# Patient Record
Sex: Female | Born: 1985 | Race: Black or African American | Hispanic: No | Marital: Single | Smoking: Current some day smoker
Health system: Southern US, Community
[De-identification: ages and names within clinical notes are randomized; demographics above are authoritative.]

## PROBLEM LIST (undated history)

## (undated) DIAGNOSIS — J45909 Unspecified asthma, uncomplicated: Secondary | ICD-10-CM

---

## 2018-01-05 ENCOUNTER — Encounter (HOSPITAL_COMMUNITY): Payer: Self-pay

## 2018-01-05 ENCOUNTER — Emergency Department (HOSPITAL_COMMUNITY)
Admission: EM | Admit: 2018-01-05 | Discharge: 2018-01-05 | Disposition: A | Discharge status: Home / Self Care | Payer: Self-pay | Attending: Emergency Medicine | Admitting: Emergency Medicine

## 2018-01-05 ENCOUNTER — Emergency Department (HOSPITAL_COMMUNITY): Payer: Self-pay

## 2018-01-05 ENCOUNTER — Other Ambulatory Visit: Payer: Self-pay

## 2018-01-05 DIAGNOSIS — F17210 Nicotine dependence, cigarettes, uncomplicated: Secondary | ICD-10-CM | POA: Insufficient documentation

## 2018-01-05 DIAGNOSIS — Z23 Encounter for immunization: Secondary | ICD-10-CM | POA: Insufficient documentation

## 2018-01-05 DIAGNOSIS — N939 Abnormal uterine and vaginal bleeding, unspecified: Secondary | ICD-10-CM | POA: Insufficient documentation

## 2018-01-05 DIAGNOSIS — J45909 Unspecified asthma, uncomplicated: Secondary | ICD-10-CM | POA: Insufficient documentation

## 2018-01-05 DIAGNOSIS — R079 Chest pain, unspecified: Secondary | ICD-10-CM | POA: Insufficient documentation

## 2018-01-05 HISTORY — DX: Unspecified asthma, uncomplicated: J45.909

## 2018-01-05 LAB — WET PREP, GENITAL
Clue Cells Wet Prep HPF POC: NONE SEEN
Sperm: NONE SEEN
TRICH WET PREP: NONE SEEN
Yeast Wet Prep HPF POC: NONE SEEN

## 2018-01-05 MED ORDER — TETANUS-DIPHTH-ACELL PERTUSSIS 5-2.5-18.5 LF-MCG/0.5 IM SUSP
0.5000 mL | Freq: Once | INTRAMUSCULAR | Status: AC
  Administered 2018-01-05: 0.5 mL via INTRAMUSCULAR
  Filled 2018-01-05: qty 0.5

## 2018-01-05 MED ORDER — KETOROLAC TROMETHAMINE 30 MG/ML IJ SOLN
15.0000 mg | Freq: Once | INTRAMUSCULAR | Status: AC
  Administered 2018-01-05: 15 mg via INTRAMUSCULAR
  Filled 2018-01-05: qty 1

## 2018-01-05 NOTE — ED Notes (Signed)
Skin tear noted to rt upper thigh, cleaned with saline, Telfa and gauze dressing applied.

## 2018-01-05 NOTE — ED Notes (Signed)
ED Provider at bedside. 

## 2018-01-05 NOTE — ED Notes (Signed)
Right hand has been cleaned and dressed.
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
## 2018-01-05 NOTE — ED Notes (Signed)
Pt refused to provide urine sample, stated "If they don't have an order, they won't get any urine, I know my rights." Pt reports small gash on upper right leg and vaginal bleeding. Reported to RN
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
## 2018-01-07 LAB — GC/CHLAMYDIA PROBE AMP (~~LOC~~) NOT AT ARMC
CHLAMYDIA, DNA PROBE: NEGATIVE
Neisseria Gonorrhea: NEGATIVE

## 2019-04-29 ENCOUNTER — Other Ambulatory Visit: Payer: Self-pay

## 2019-04-29 ENCOUNTER — Emergency Department (HOSPITAL_COMMUNITY): Payer: Self-pay

## 2019-04-29 ENCOUNTER — Emergency Department (HOSPITAL_COMMUNITY)
Admission: EM | Admit: 2019-04-29 | Discharge: 2019-04-29 | Disposition: A | Discharge status: Home / Self Care | Payer: Self-pay | Attending: Emergency Medicine | Admitting: Emergency Medicine

## 2019-04-29 ENCOUNTER — Encounter (HOSPITAL_COMMUNITY): Payer: Self-pay

## 2019-04-29 DIAGNOSIS — F17290 Nicotine dependence, other tobacco product, uncomplicated: Secondary | ICD-10-CM | POA: Insufficient documentation

## 2019-04-29 DIAGNOSIS — N730 Acute parametritis and pelvic cellulitis: Secondary | ICD-10-CM

## 2019-04-29 DIAGNOSIS — B9689 Other specified bacterial agents as the cause of diseases classified elsewhere: Secondary | ICD-10-CM | POA: Insufficient documentation

## 2019-04-29 DIAGNOSIS — N760 Acute vaginitis: Secondary | ICD-10-CM | POA: Insufficient documentation

## 2019-04-29 DIAGNOSIS — J45909 Unspecified asthma, uncomplicated: Secondary | ICD-10-CM | POA: Insufficient documentation

## 2019-04-29 DIAGNOSIS — R102 Pelvic and perineal pain: Secondary | ICD-10-CM | POA: Insufficient documentation

## 2019-04-29 DIAGNOSIS — N739 Female pelvic inflammatory disease, unspecified: Secondary | ICD-10-CM | POA: Insufficient documentation

## 2019-04-29 LAB — URINALYSIS, ROUTINE W REFLEX MICROSCOPIC
Bilirubin Urine: NEGATIVE
Glucose, UA: NEGATIVE mg/dL
Ketones, ur: 5 mg/dL — AB
Nitrite: NEGATIVE
Protein, ur: NEGATIVE mg/dL
Specific Gravity, Urine: 1.018 (ref 1.005–1.030)
pH: 7 (ref 5.0–8.0)

## 2019-04-29 LAB — COMPREHENSIVE METABOLIC PANEL
ALT: 21 U/L (ref 0–44)
AST: 18 U/L (ref 15–41)
Albumin: 3.5 g/dL (ref 3.5–5.0)
Alkaline Phosphatase: 73 U/L (ref 38–126)
Anion gap: 8 (ref 5–15)
BUN: 7 mg/dL (ref 6–20)
CO2: 23 mmol/L (ref 22–32)
Calcium: 8.6 mg/dL — ABNORMAL LOW (ref 8.9–10.3)
Chloride: 105 mmol/L (ref 98–111)
Creatinine, Ser: 0.76 mg/dL (ref 0.44–1.00)
GFR calc Af Amer: >60 mL/min (ref >60)
GFR calc non Af Amer: >60 mL/min (ref >60)
Glucose, Bld: 89 mg/dL (ref 70–99)
Potassium: 3.7 mmol/L (ref 3.5–5.1)
Sodium: 136 mmol/L (ref 135–145)
Total Bilirubin: 0.7 mg/dL (ref 0.3–1.2)
Total Protein: 7.5 g/dL (ref 6.5–8.1)

## 2019-04-29 LAB — CBC WITH DIFFERENTIAL/PLATELET
Abs Immature Granulocytes: 0.04 10*3/uL (ref 0.00–0.07)
Basophils Absolute: 0 10*3/uL (ref 0.0–0.1)
Basophils Relative: 0 %
Eosinophils Absolute: 0.1 10*3/uL (ref 0.0–0.5)
Eosinophils Relative: 1 %
HCT: 36.8 % (ref 36.0–46.0)
Hemoglobin: 12.6 g/dL (ref 12.0–15.0)
Immature Granulocytes: 0 %
Lymphocytes Relative: 16 %
Lymphs Abs: 2.2 10*3/uL (ref 0.7–4.0)
MCH: 33 pg (ref 26.0–34.0)
MCHC: 34.2 g/dL (ref 30.0–36.0)
MCV: 96.3 fL (ref 80.0–100.0)
Monocytes Absolute: 1.3 10*3/uL — ABNORMAL HIGH (ref 0.1–1.0)
Monocytes Relative: 10 %
Neutro Abs: 9.6 10*3/uL — ABNORMAL HIGH (ref 1.7–7.7)
Neutrophils Relative %: 73 %
Platelets: 224 10*3/uL (ref 150–400)
RBC: 3.82 MIL/uL — ABNORMAL LOW (ref 3.87–5.11)
RDW: 11.9 % (ref 11.5–15.5)
WBC: 13.2 10*3/uL — ABNORMAL HIGH (ref 4.0–10.5)
nRBC: 0 % (ref 0.0–0.2)

## 2019-04-29 LAB — WET PREP, GENITAL
Sperm: NONE SEEN
Trich, Wet Prep: NONE SEEN
Yeast Wet Prep HPF POC: NONE SEEN

## 2019-04-29 LAB — I-STAT BETA HCG BLOOD, ED (MC, WL, AP ONLY): I-stat hCG, quantitative: 12.7 m[IU]/mL — ABNORMAL HIGH (ref <5)

## 2019-04-29 LAB — HCG, QUANTITATIVE, PREGNANCY: hCG, Beta Chain, Quant, S: <1 m[IU]/mL (ref <5)

## 2019-04-29 MED ORDER — DOXYCYCLINE HYCLATE 100 MG PO CAPS: 100.0000 mg | ORAL_CAPSULE | Freq: Two times a day (BID) | ORAL | 0 refills | Status: AC

## 2019-04-29 MED ORDER — METRONIDAZOLE 500 MG PO TABS: 500.0000 mg | ORAL_TABLET | Freq: Two times a day (BID) | ORAL | 0 refills | Status: AC

## 2019-04-29 MED ORDER — CEFTRIAXONE SODIUM 250 MG IJ SOLR
250.0000 mg | Freq: Once | INTRAMUSCULAR | Status: AC
  Administered 2019-04-29: 15:00:00 250 mg via INTRAMUSCULAR
  Filled 2019-04-29: qty 250

## 2019-04-29 MED ORDER — AZITHROMYCIN 250 MG PO TABS
1000.0000 mg | ORAL_TABLET | Freq: Once | ORAL | Status: AC
  Administered 2019-04-29: 1000 mg via ORAL
  Filled 2019-04-29: qty 4

## 2019-04-29 MED ORDER — KETOROLAC TROMETHAMINE 15 MG/ML IJ SOLN
15.0000 mg | Freq: Once | INTRAMUSCULAR | Status: AC
  Administered 2019-04-29: 12:00:00 15 mg via INTRAVENOUS
  Filled 2019-04-29: qty 1

## 2019-04-29 MED ORDER — LIDOCAINE HCL 1 % IJ SOLN
INTRAMUSCULAR | Status: AC
  Administered 2019-04-29: 0.9 mL
  Filled 2019-04-29: qty 20

## 2019-04-29 NOTE — ED Triage Notes (Signed)
Patient c/o lower abdominal pain x 7-8 days. Patient states she has had vaginal bleeding/spotting x 4-5 days ago. Patient states she normally does not have a period.

## 2019-04-29 NOTE — Discharge Instructions (Addendum)
Take antibiotics as prescribed. Take the entire course, even if your symptoms improve.  Take ibuprofen 3 times a day with meals.  Do not take other anti-inflammatories at the same time (Advil, Motrin, naproxen, Aleve). You may supplement with Tylenol if you need further pain control. Use ice packs or heating pads if this helps control your pain. Follow up with ob/gyn as needed.  Return to the ER with any new, worsening, or concerning symptoms.

## 2019-04-29 NOTE — ED Provider Notes (Signed)
[facility]-EMERGENCY DEPT Provider Note   CSN: [id number] Arrival date & time: 04/29/19  1007     History   Chief Complaint Chief Complaint  Patient presents with   Abdominal Pain    HPI Tracy Craig is a 33 y.o. female presenting for evaluation of abdominal pain.  Patient states the past 7 days, she has been having severe lower abdominal cramping pain.  At first thought it was menstruation cramps, but she does not normally have cramps.  Patient had some spotting in the first several days when the pain started, but is no longer bleeding.  She states she had had intercourse the day before the pain began, it was uncomfortable but did not bring on this acute or severe pain.  She sexually active with one female partner who is symptom-free.  She has an IUD which was placed 3 years ago at [facility], but she is currently without insurance and does not follow with OB/GYN.  She denies fevers, chills, cough, chest pain, shortness breath, nausea, vomiting, urinary symptoms.  She is unsure if she is having discharge.  She has no other medical problems, takes no medications daily.     HPI  Past Medical History:  Diagnosis Date   Asthma     There are no active problems to display for this patient.   History reviewed. No pertinent surgical history.   OB History   No obstetric history on file.      Home Medications    Prior to Admission medications   Medication Sig Start Date End Date Taking? Authorizing Provider  doxycycline (VIBRAMYCIN) 100 MG capsule Take 1 capsule (100 mg total) by mouth 2 (two) times daily for 14 days. 04/29/19 05/13/19  Fionna Merriott, PA-C  metroNIDAZOLE (FLAGYL) 500 MG tablet Take 1 tablet (500 mg total) by mouth 2 (two) times daily. 04/29/19   Siani Utke, PA-C    Family History Family History  Problem Relation Age of Onset   Liver disease Father    Kidney failure Father     Social History Social History   Tobacco Use   Smoking  status: Current Some Day Smoker    Types: Cigars   Smokeless tobacco: Never Used  Substance Use Topics   Alcohol use: Yes   Drug use: Not Currently     Allergies   Patient has no known allergies.   Review of Systems Review of Systems  Gastrointestinal: Positive for abdominal pain.  Genitourinary: Positive for vaginal bleeding.  All other systems reviewed and are negative.    Physical Exam Updated Vital Signs BP 120/74    Pulse (!) 56    Temp 98.5 F (36.9 C) (Oral)    Resp 18    Ht 5\' 6"  (1.676 m)    Wt 71.2 kg    SpO2 100%    BMI 25.34 kg/m   Physical Exam Vitals signs and nursing note reviewed. Exam conducted with a chaperone present.  Constitutional:      General: She is not in acute distress.    Appearance: She is well-developed.  HENT:     Head: Normocephalic and atraumatic.  Eyes:     Extraocular Movements: Extraocular movements intact.     Conjunctiva/sclera: Conjunctivae normal.     Pupils: Pupils are equal, round, and reactive to light.  Neck:     Musculoskeletal: Normal range of motion and neck supple.  Cardiovascular:     Rate and Rhythm: Normal rate and regular rhythm.  Pulses: Normal pulses.  Pulmonary:     Effort: Pulmonary effort is normal. No respiratory distress.     Breath sounds: Normal breath sounds. No wheezing.  Abdominal:     General: Bowel sounds are normal. There is no distension.     Palpations: Abdomen is soft.     Tenderness: There is abdominal tenderness in the suprapubic area.     Comments: Tenderness palpation of lower abdomen, specifically suprapubic.  No tenderness palpation elsewhere in the abdomen.  Soft that rigidity, guarding, distention.  Negative rebound.  Genitourinary:    Cervix: Cervical motion tenderness, discharge and friability present.     Comments: Copious yellow discharge.  Friable cervix.  CMT.  No specific adnexal tenderness. Musculoskeletal: Normal range of motion.  Skin:    General: Skin is warm and dry.      Capillary Refill: Capillary refill takes less than 2 seconds.  Neurological:     Mental Status: She is alert and oriented to person, place, and time.      ED Treatments / Results  Labs (all labs ordered are listed, but only abnormal results are displayed) Labs Reviewed  WET PREP, GENITAL - Abnormal; Notable for the following components:      Result Value   Clue Cells Wet Prep HPF POC PRESENT (*)    WBC, Wet Prep HPF POC MANY (*)    All other components within normal limits  CBC WITH DIFFERENTIAL/PLATELET - Abnormal; Notable for the following components:   WBC 13.2 (*)    RBC 3.82 (*)    Neutro Abs 9.6 (*)    Monocytes Absolute 1.3 (*)    All other components within normal limits  COMPREHENSIVE METABOLIC PANEL - Abnormal; Notable for the following components:   Calcium 8.6 (*)    All other components within normal limits  URINALYSIS, ROUTINE W REFLEX MICROSCOPIC - Abnormal; Notable for the following components:   Hgb urine dipstick SMALL (*)    Ketones, ur 5 (*)    Leukocytes,Ua MODERATE (*)    Bacteria, UA RARE (*)    All other components within normal limits  I-STAT BETA HCG BLOOD, ED ([facility], [facility], [facility] ONLY) - Abnormal; Notable for the following components:   I-stat hCG, quantitative 12.7 (*)    All other components within normal limits  HCG, QUANTITATIVE, PREGNANCY  GC/CHLAMYDIA PROBE AMP ([place]) NOT AT [facility]    EKG None  Radiology [place] Transvaginal Non-ob  Result Date: 04/29/2019 CLINICAL DATA:  Pelvic pain question tubo-ovarian abscess versus PID, history of IUD placement 3 years ago EXAM: TRANSABDOMINAL AND TRANSVAGINAL ULTRASOUND OF PELVIS DOPPLER ULTRASOUND OF OVARIES TECHNIQUE: Both transabdominal and transvaginal ultrasound examinations of the pelvis were performed. Transabdominal technique was performed for global imaging of the pelvis including uterus, ovaries, adnexal regions, and pelvic cul-de-sac. It was necessary to proceed with endovaginal exam following  the transabdominal exam to visualize the IUD and RIGHT ovary. Color and duplex Doppler ultrasound was utilized to evaluate blood flow to the ovaries. COMPARISON:  None FINDINGS: Uterus Measurements: 7.1 x 3.4 x 4.9 cm = volume: 62 mL. Anteverted. Normal morphology without mass. Small nabothian cyst at cervix. Endometrium Thickness: 4 mm. No endometrial fluid or focal abnormality. IUD in expected position at the upper uterine segment. Right ovary Measurements: 4.3 x 2.2 x 2.3 cm = volume: 11.5 mL. Normal morphology without mass. Internal blood flow present on color Doppler imaging. Left ovary Measurements: 4.4 x 3.5 x 3.3 cm = volume: 25.8 mL. Normal morphology without  mass. Internal blood flow present on color Doppler imaging. Pulsed Doppler evaluation of both ovaries demonstrates normal low-resistance arterial and venous waveforms. Other findings Trace free pelvic fluid, potentially physiologic. No adnexal masses. No evidence of hydrosalpinx or abnormal focal pelvic fluid collections. IMPRESSION: IUD within expected position at the upper uterine segment. Otherwise negative exam. No evidence of ovarian mass or torsion. Electronically Signed   By: Lavonia Dana M.D.   On: 04/29/2019 14:27   US Pelvis Complete  Result Date: 04/29/2019 CLINICAL DATA:  Pelvic pain question tubo-ovarian abscess versus PID, history of IUD placement 3 years ago EXAM: [place] OF OVARIES TECHNIQUE: Both transabdominal and transvaginal ultrasound examinations of the pelvis were performed. Transabdominal technique was performed for global imaging of the pelvis including uterus, ovaries, adnexal regions, and pelvic cul-de-sac. It was necessary to proceed with endovaginal exam following the transabdominal exam to visualize the IUD and RIGHT ovary. Color and duplex Doppler ultrasound was utilized to evaluate blood flow to the ovaries. COMPARISON:  None FINDINGS: Uterus Measurements:  7.1 x 3.4 x 4.9 cm = volume: 62 mL. Anteverted. Normal morphology without mass. Small nabothian cyst at cervix. Endometrium Thickness: 4 mm. No endometrial fluid or focal abnormality. IUD in expected position at the upper uterine segment. Right ovary Measurements: 4.3 x 2.2 x 2.3 cm = volume: 11.5 mL. Normal morphology without mass. Internal blood flow present on color Doppler imaging. Left ovary Measurements: 4.4 x 3.5 x 3.3 cm = volume: 25.8 mL. Normal morphology without mass. Internal blood flow present on color Doppler imaging. Pulsed Doppler evaluation of both ovaries demonstrates normal low-resistance arterial and venous waveforms. Other findings Trace free pelvic fluid, potentially physiologic. No adnexal masses. No evidence of hydrosalpinx or abnormal focal pelvic fluid collections. IMPRESSION: IUD within expected position at the upper uterine segment. Otherwise negative exam. No evidence of ovarian mass or torsion. Electronically Signed   By: Lavonia Dana M.D.   On: 04/29/2019 14:27   [place] Art/ven Flow Abd Pelv Doppler  Result Date: 04/29/2019 CLINICAL DATA:  Pelvic pain question tubo-ovarian abscess versus PID, history of IUD placement 3 years ago EXAM: [place] OF OVARIES TECHNIQUE: Both transabdominal and transvaginal ultrasound examinations of the pelvis were performed. Transabdominal technique was performed for global imaging of the pelvis including uterus, ovaries, adnexal regions, and pelvic cul-de-sac. It was necessary to proceed with endovaginal exam following the transabdominal exam to visualize the IUD and RIGHT ovary. Color and duplex Doppler ultrasound was utilized to evaluate blood flow to the ovaries. COMPARISON:  None FINDINGS: Uterus Measurements: 7.1 x 3.4 x 4.9 cm = volume: 62 mL. Anteverted. Normal morphology without mass. Small nabothian cyst at cervix. Endometrium Thickness: 4 mm. No endometrial fluid or focal abnormality.  IUD in expected position at the upper uterine segment. Right ovary Measurements: 4.3 x 2.2 x 2.3 cm = volume: 11.5 mL. Normal morphology without mass. Internal blood flow present on color Doppler imaging. Left ovary Measurements: 4.4 x 3.5 x 3.3 cm = volume: 25.8 mL. Normal morphology without mass. Internal blood flow present on color Doppler imaging. Pulsed Doppler evaluation of both ovaries demonstrates normal low-resistance arterial and venous waveforms. Other findings Trace free pelvic fluid, potentially physiologic. No adnexal masses. No evidence of hydrosalpinx or abnormal focal pelvic fluid collections. IMPRESSION: IUD within expected position at the upper uterine segment. Otherwise negative exam. No evidence of ovarian mass or torsion. Electronically Signed   By: Elta Guadeloupe  Tyron RussellBoles M.D.   On: 04/29/2019 14:27    Procedures Procedures (including critical care time)  Medications Ordered in ED Medications  cefTRIAXone (ROCEPHIN) injection 250 mg (has no administration in time range)  azithromycin (ZITHROMAX) tablet 1,000 mg (has no administration in time range)  ketorolac (TORADOL) 15 MG/ML injection 15 mg (15 mg Intravenous Given 04/29/19 1139)     Initial Impression / Assessment and Plan / ED Course  I have reviewed the triage vital signs and the nursing notes.  Pertinent labs & imaging results that were available during my care of the patient were reviewed by me and considered in my medical decision making (see chart for details).        Presenting for evaluation of lower abdominal pain.  Physical exam shows patient who appears nontoxic.  She is afebrile not tachycardic.  She does have tenderness palpation of the lower abdomen.  Concerned about pelvic/GU etiology.  Will obtain labs and perform pelvic exam.  Toradol for pain.  Toradol improved patient's pain.  Pelvic with copious discharge, friable cervix, and CMT.  Will obtain ultrasound to rule out TOA versus alignment of IUD versus other  acute GU findings.  Labs show white count of 13.2.  Initial hCG positive, however quant was negative.  Wet prep positive for clue cells.  Ultrasound pending.  Ultrasound without TOA.  Shows normal placement of IUD.  Otherwise no concerning findings on the ultrasound.  Discussed findings with patient.  Discussed my concern for PID, especially in the setting of white count and discharge and abdominal pain.  Discussed that STD testing is pending.  Discussed importance of follow-up with OB/GYN if symptoms not improving, resources given for f/u.Marland Kitchen.  Patient given dose of Ceftin and azithromycin in the ER.  Will treat BV with Flagyl and PID with Doxy.  At this time, patient appears safe for discharge.  Return precautions given.  Patient states she understands and agrees to plan.  Final Clinical Impressions(s) / ED Diagnoses   Final diagnoses:  PID (acute pelvic inflammatory disease)  BV (bacterial vaginosis)    ED Discharge Orders         Ordered    doxycycline (VIBRAMYCIN) 100 MG capsule  2 times daily     04/29/19 1450    metroNIDAZOLE (FLAGYL) 500 MG tablet  2 times daily     04/29/19 1511           Arvel Oquinn, PA-C 04/29/19 1542    Lorre NickAllen, Anthony, MD 04/30/19 [phone]

## 2019-04-30 LAB — GC/CHLAMYDIA PROBE AMP (~~LOC~~) NOT AT ARMC
Chlamydia: NEGATIVE
Neisseria Gonorrhea: POSITIVE — AB

## 2020-04-20 IMAGING — US US ART/VEN ABD/PELV/SCROTUM DOPPLER LTD
1 series · 13 of 25 positions shown · non-contrast
Comparison: None

CLINICAL DATA: Pelvic pain question tubo-ovarian abscess versus
PID, history of IUD placement 3 years ago

EXAM:
TRANSABDOMINAL AND TRANSVAGINAL ULTRASOUND OF PELVIS
DOPPLER ULTRASOUND OF OVARIES
TECHNIQUE: Both transabdominal and transvaginal ultrasound examinations of the
pelvis were performed. Transabdominal technique was performed for
global imaging of the pelvis including uterus, ovaries, adnexal
regions, and pelvic cul-de-sac.
It was necessary to proceed with endovaginal exam following the
transabdominal exam to visualize the IUD and RIGHT ovary. Color and
duplex Doppler ultrasound was utilized to evaluate blood flow to the
ovaries.

[Series 1: us art/ven abd/pelv/scrotum doppler ltd · 13 of 143 slices shown]
[im 1/143]
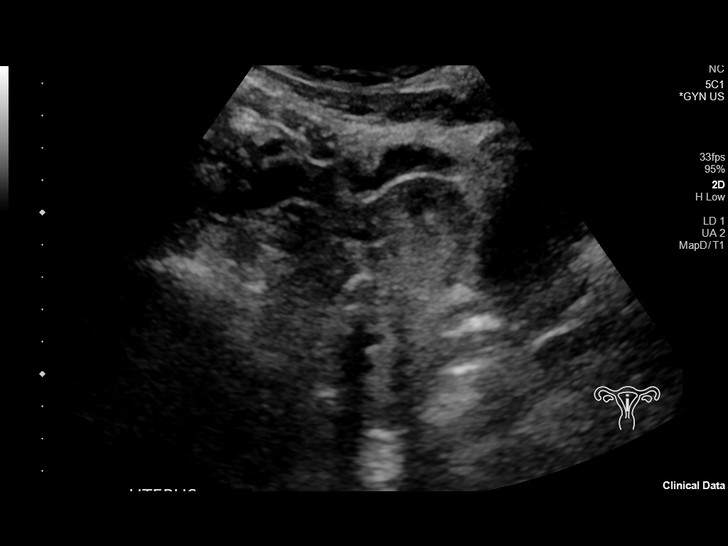
[im 12/143]
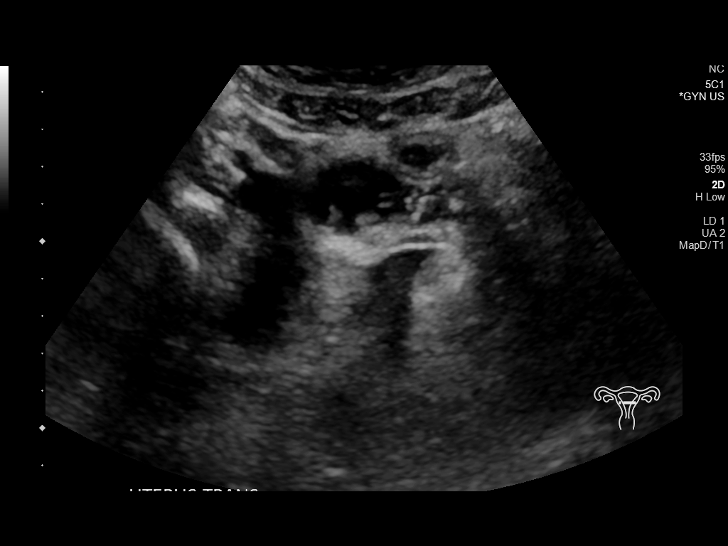
[im 24/143]
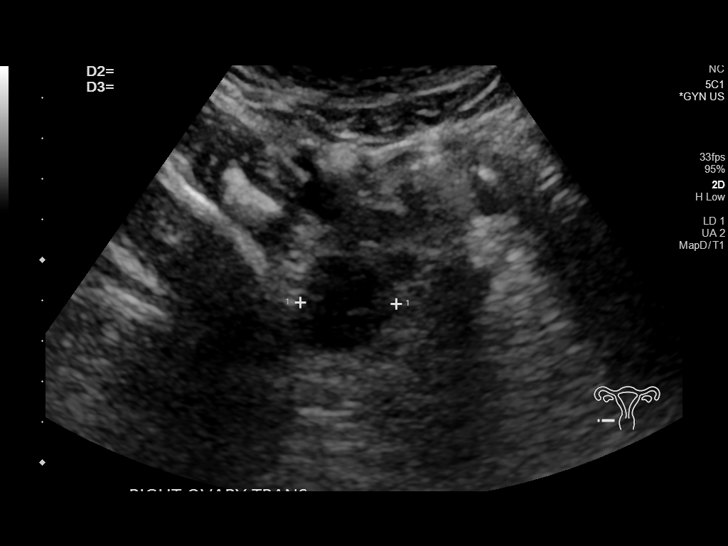
[im 36/143]
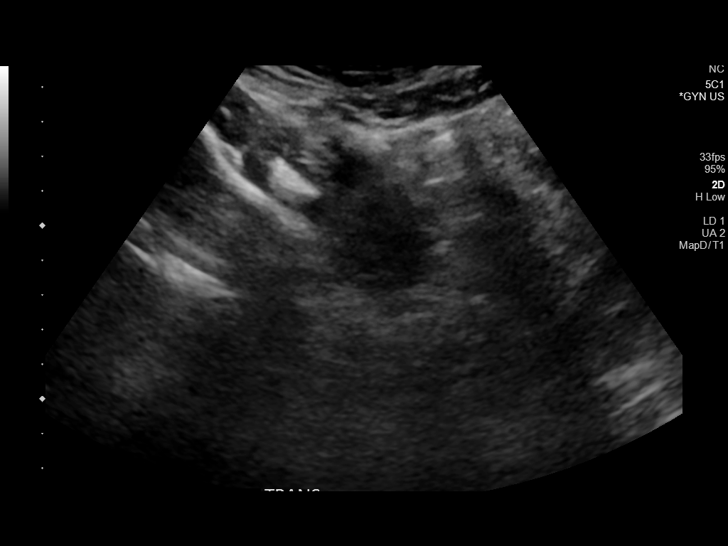
[im 48/143]
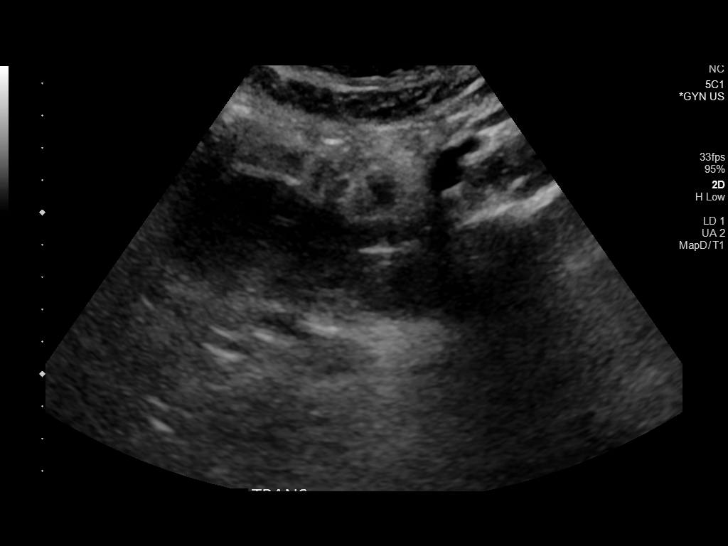
[im 60/143]
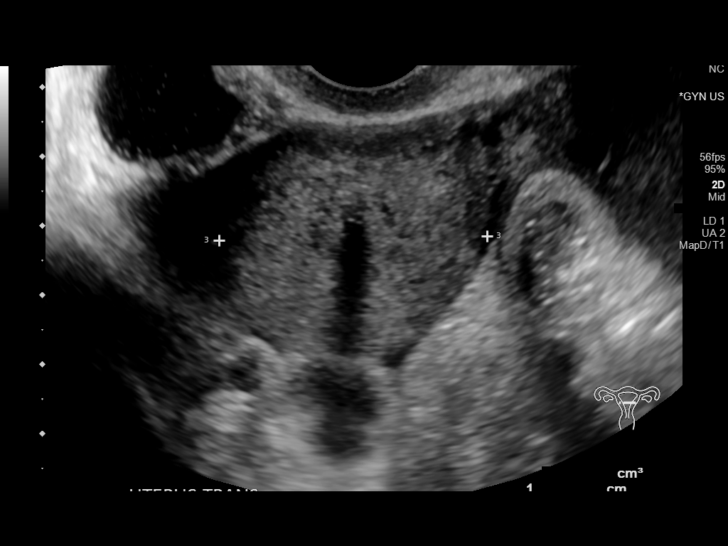
[im 72/143]
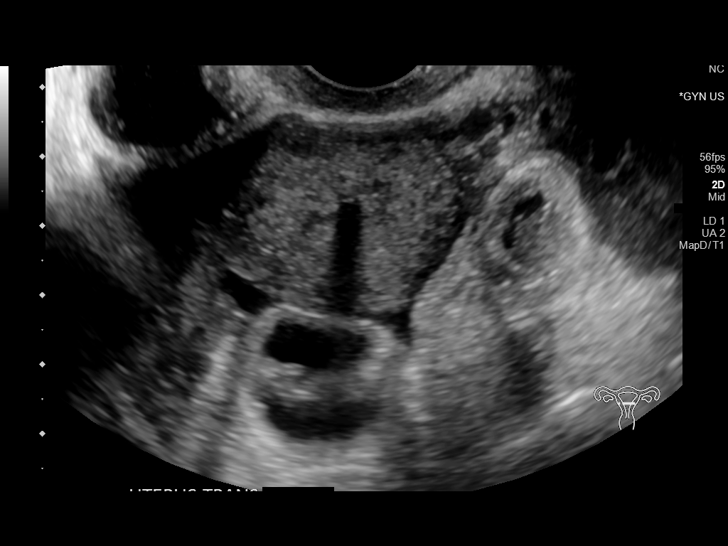
[im 83/143]
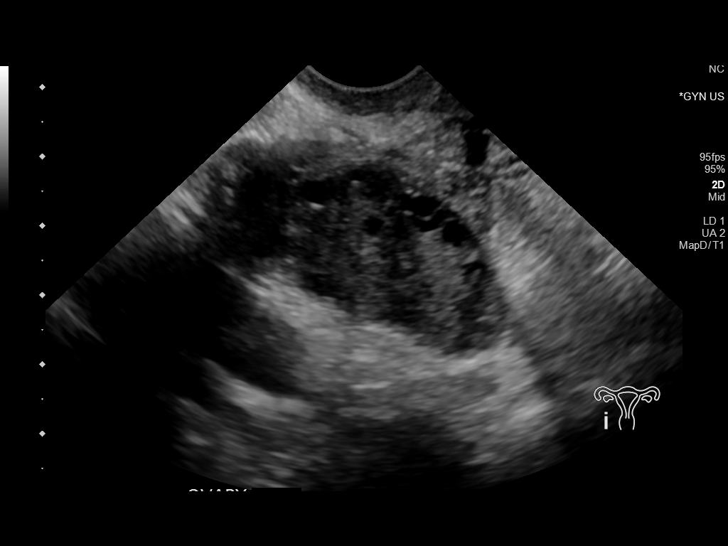
[im 95/143]
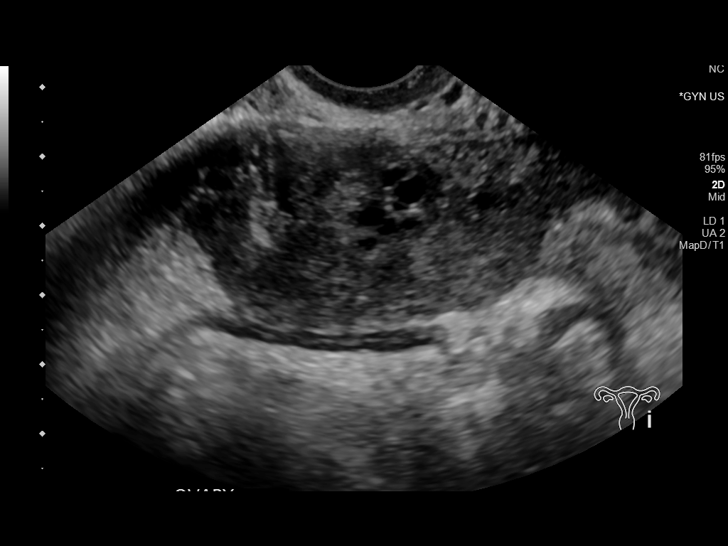
[im 107/143]
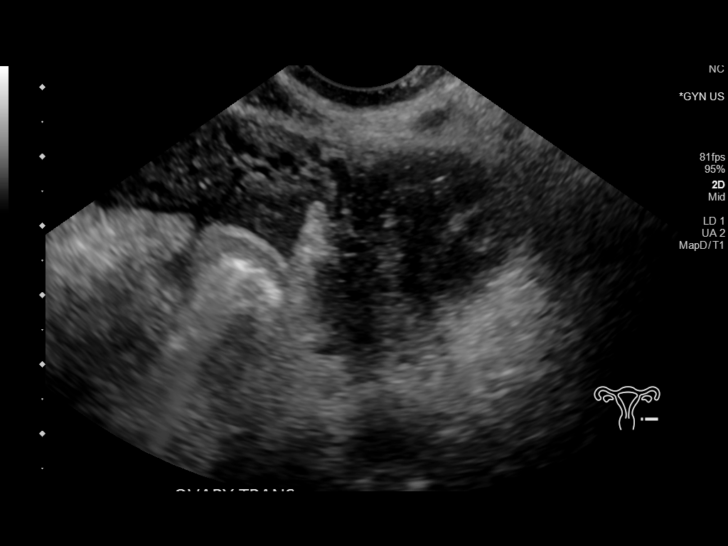
[im 119/143]
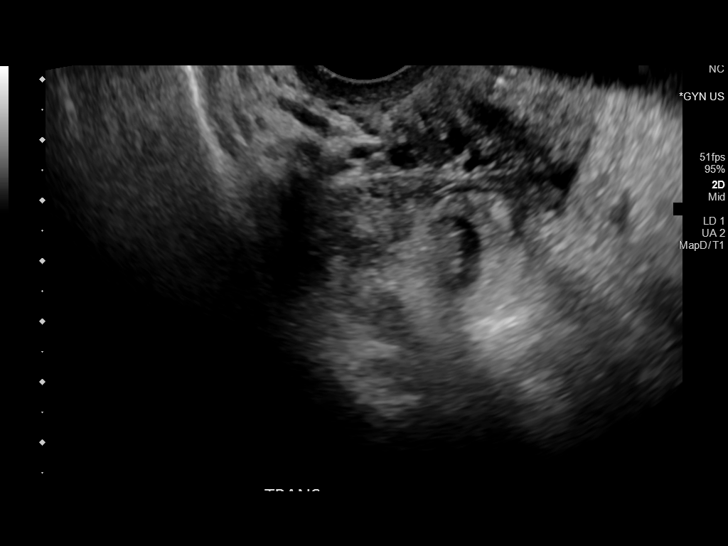
[im 131/143]
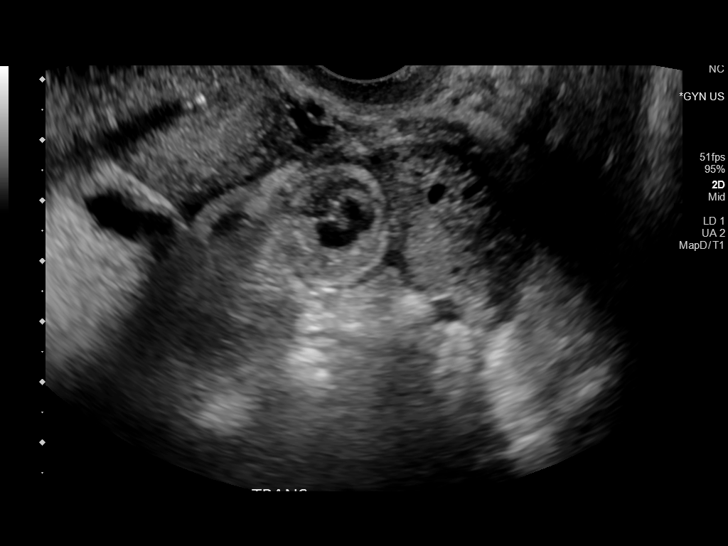
[im 143/143]
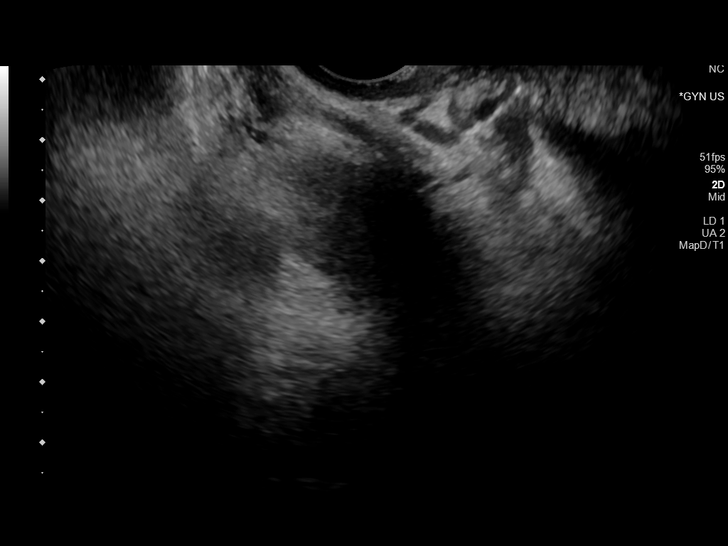

[13 of 25 positions shown; findings below may reference images not displayed]

FINDINGS: Uterus

Measurements: 7.1 x 3.4 x 4.9 cm = volume: 62 mL. Anteverted. Normal
morphology without mass. Small nabothian cyst at cervix.

Endometrium

Thickness: 4 mm. No endometrial fluid or focal abnormality. IUD in
expected position at the upper uterine segment.

Right ovary

Measurements: 4.3 x 2.2 x 2.3 cm = volume: 11.5 mL. Normal
morphology without mass. Internal blood flow present on color
Doppler imaging.

Left ovary

Measurements: 4.4 x 3.5 x 3.3 cm = volume: 25.8 mL. Normal
morphology without mass. Internal blood flow present on color
Doppler imaging.

Pulsed Doppler evaluation of both ovaries demonstrates normal
low-resistance arterial and venous waveforms.

Other findings

Trace free pelvic fluid, potentially physiologic. No adnexal masses.
No evidence of hydrosalpinx or abnormal focal pelvic fluid
collections.
IMPRESSION: IUD within expected position at the upper uterine segment.

Otherwise negative exam.

No evidence of ovarian mass or torsion.

## 2020-04-20 IMAGING — US US PELVIS COMPLETE
1 series · 13 of 25 positions shown · non-contrast
Comparison: None

CLINICAL DATA: Pelvic pain question tubo-ovarian abscess versus
PID, history of IUD placement 3 years ago

EXAM:
TRANSABDOMINAL AND TRANSVAGINAL ULTRASOUND OF PELVIS
DOPPLER ULTRASOUND OF OVARIES
TECHNIQUE: Both transabdominal and transvaginal ultrasound examinations of the
pelvis were performed. Transabdominal technique was performed for
global imaging of the pelvis including uterus, ovaries, adnexal
regions, and pelvic cul-de-sac.
It was necessary to proceed with endovaginal exam following the
transabdominal exam to visualize the IUD and RIGHT ovary. Color and
duplex Doppler ultrasound was utilized to evaluate blood flow to the
ovaries.

[Series 1: us pelvis complete · 13 of 143 slices shown]
[im 1/143]
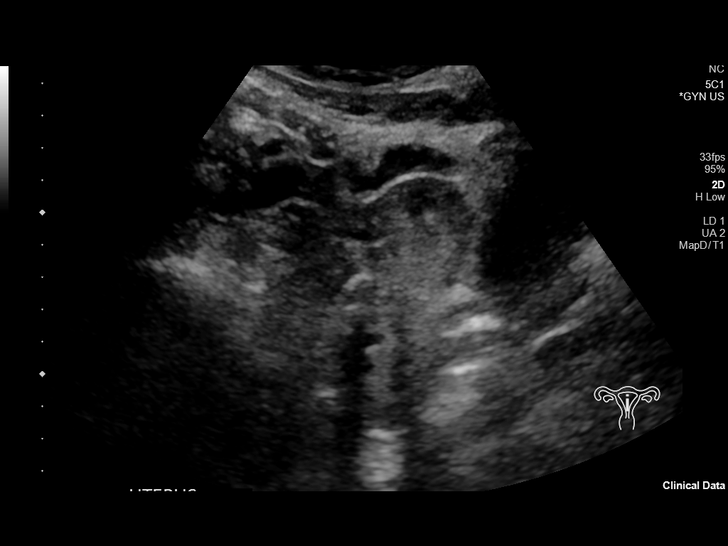
[im 12/143]
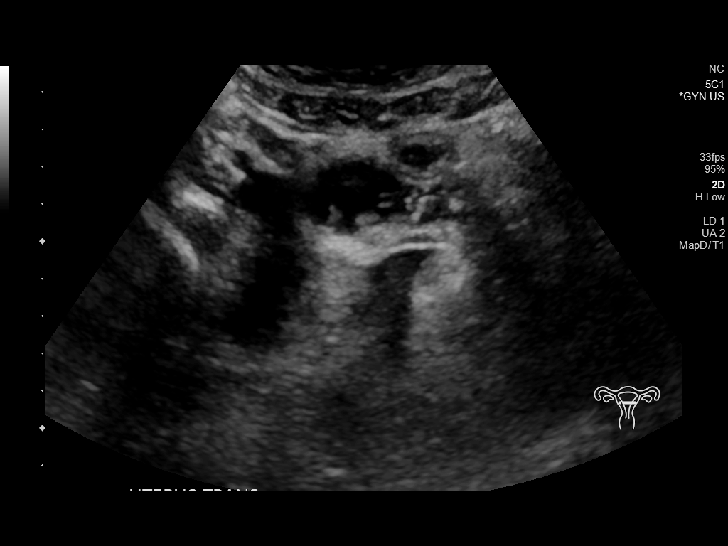
[im 24/143]
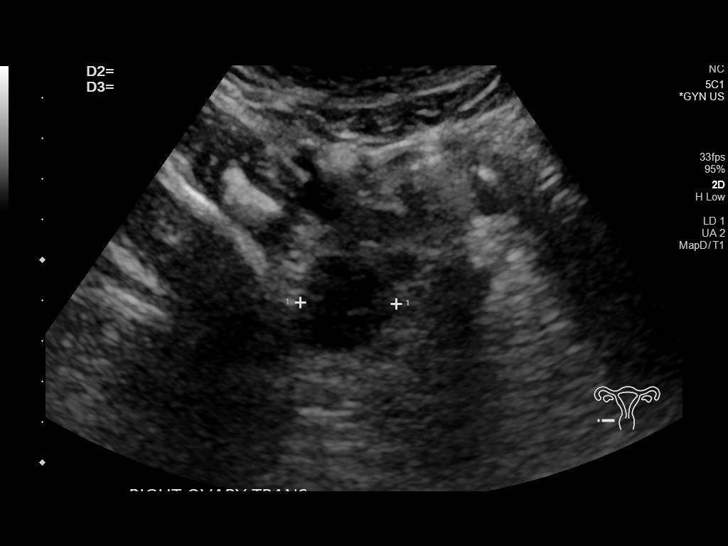
[im 36/143]
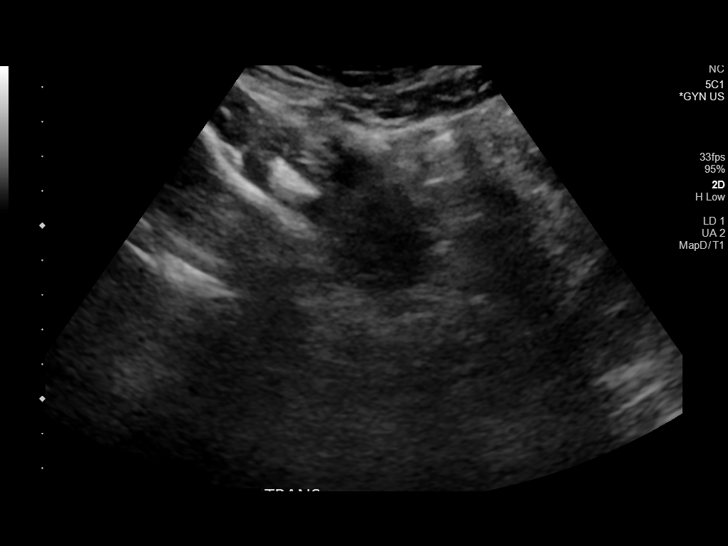
[im 48/143]
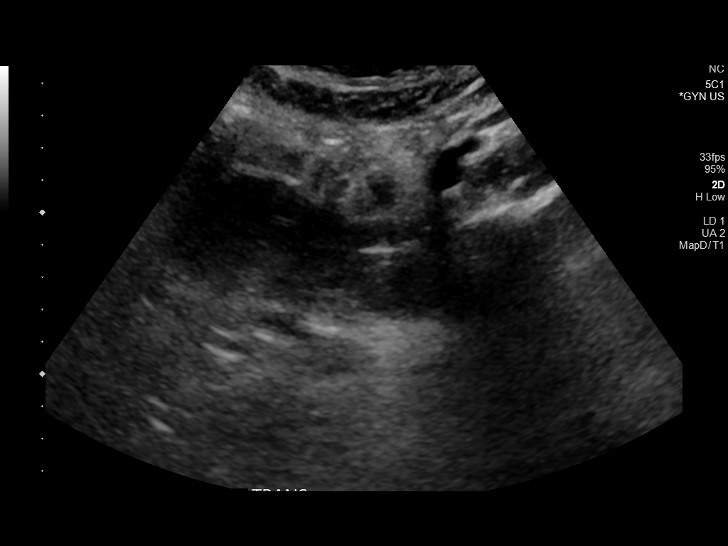
[im 60/143]
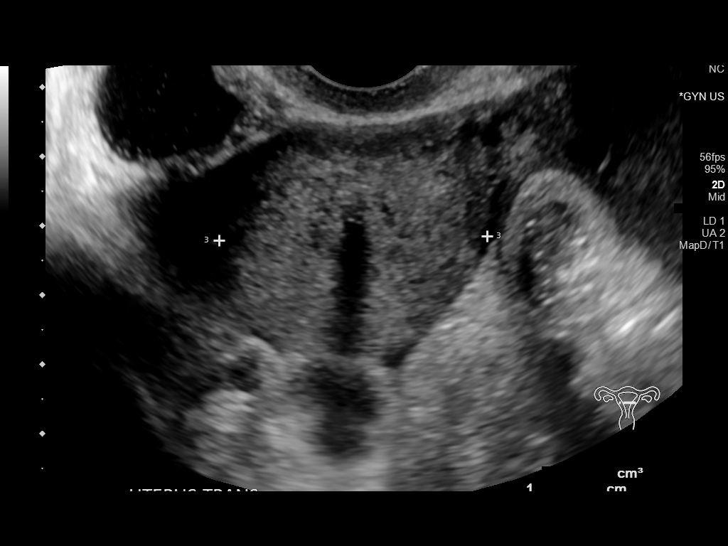
[im 72/143]
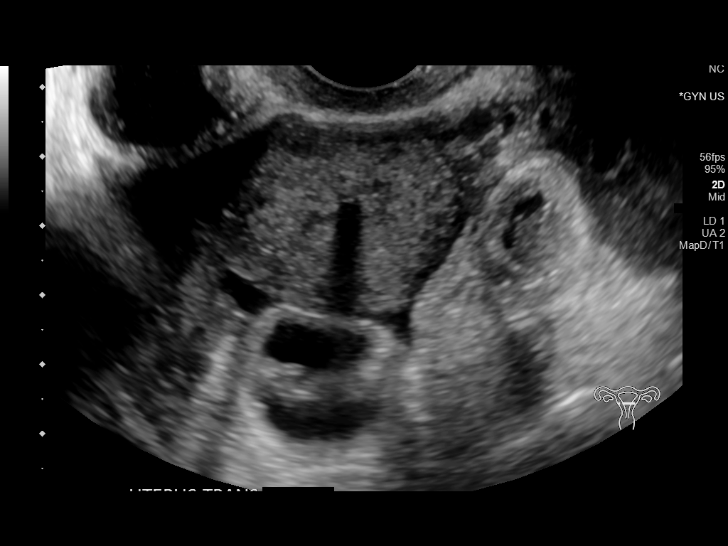
[im 83/143]
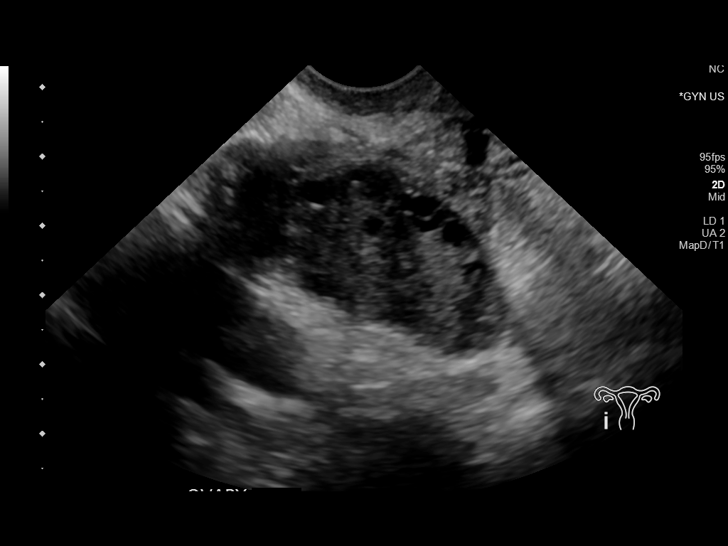
[im 95/143]
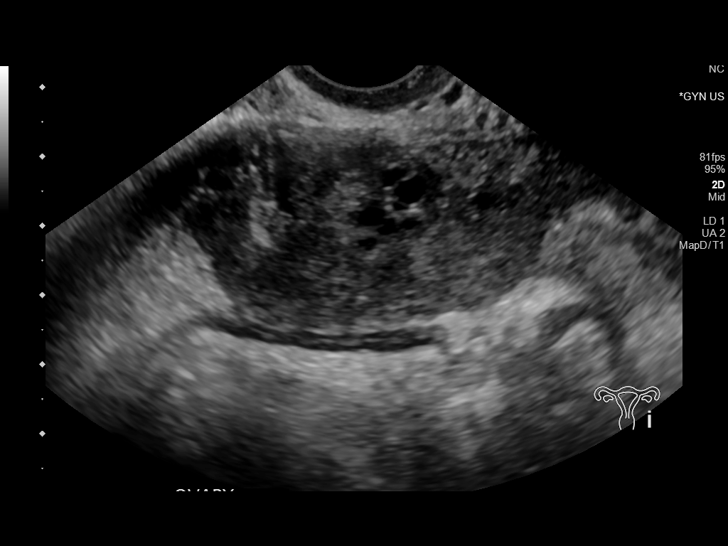
[im 107/143]
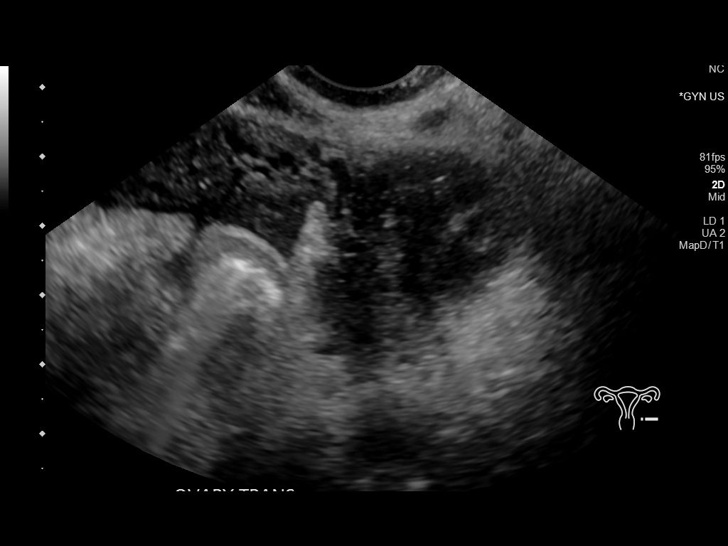
[im 119/143]
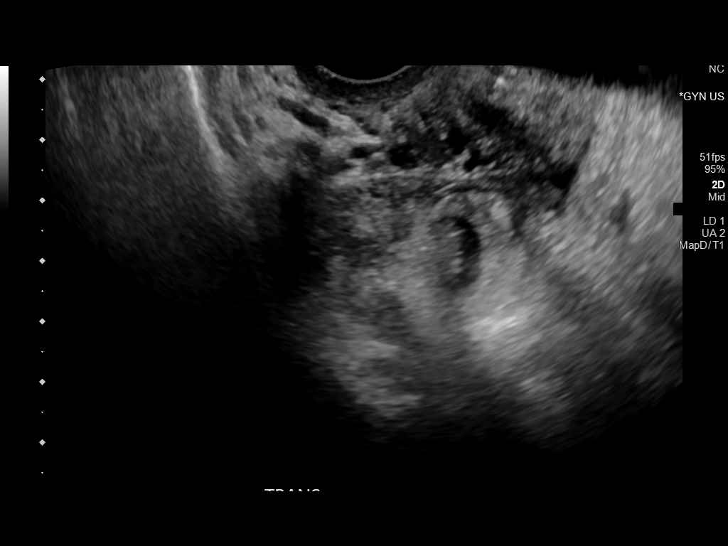
[im 131/143]
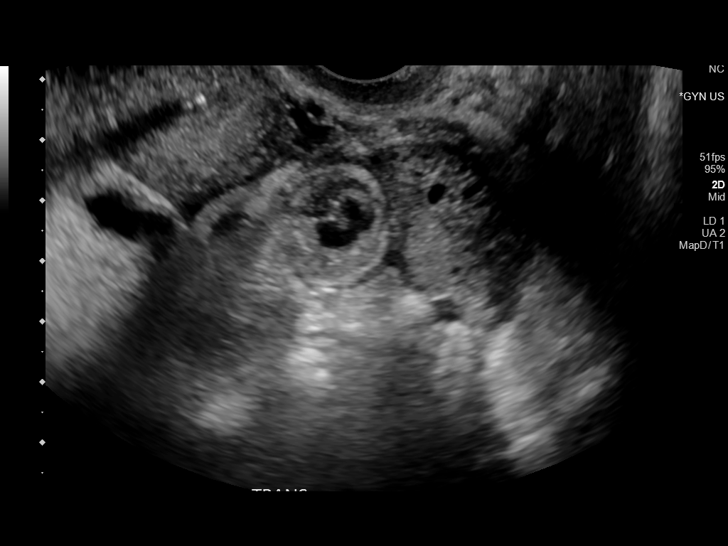
[im 143/143]
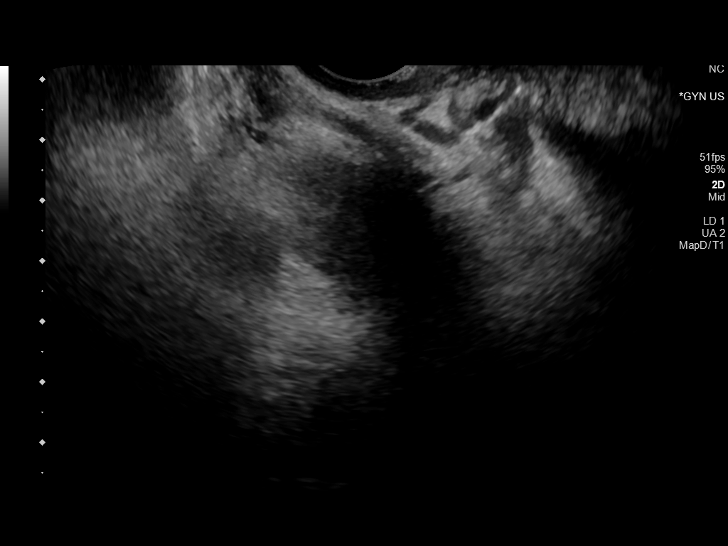

[13 of 25 positions shown; findings below may reference images not displayed]

FINDINGS: Uterus

Measurements: 7.1 x 3.4 x 4.9 cm = volume: 62 mL. Anteverted. Normal
morphology without mass. Small nabothian cyst at cervix.

Endometrium

Thickness: 4 mm. No endometrial fluid or focal abnormality. IUD in
expected position at the upper uterine segment.

Right ovary

Measurements: 4.3 x 2.2 x 2.3 cm = volume: 11.5 mL. Normal
morphology without mass. Internal blood flow present on color
Doppler imaging.

Left ovary

Measurements: 4.4 x 3.5 x 3.3 cm = volume: 25.8 mL. Normal
morphology without mass. Internal blood flow present on color
Doppler imaging.

Pulsed Doppler evaluation of both ovaries demonstrates normal
low-resistance arterial and venous waveforms.

Other findings

Trace free pelvic fluid, potentially physiologic. No adnexal masses.
No evidence of hydrosalpinx or abnormal focal pelvic fluid
collections.
IMPRESSION: IUD within expected position at the upper uterine segment.

Otherwise negative exam.

No evidence of ovarian mass or torsion.
